# Patient Record
Sex: Male | Born: 1980 | Race: White | Hispanic: No | Marital: Single | Smoking: Never smoker
Health system: Southern US, Community
[De-identification: ages and names within clinical notes are randomized; demographics above are authoritative.]

## PROBLEM LIST (undated history)

## (undated) DIAGNOSIS — Z87442 Personal history of urinary calculi: Secondary | ICD-10-CM

## (undated) DIAGNOSIS — G809 Cerebral palsy, unspecified: Secondary | ICD-10-CM

## (undated) DIAGNOSIS — K219 Gastro-esophageal reflux disease without esophagitis: Secondary | ICD-10-CM

## (undated) DIAGNOSIS — J45909 Unspecified asthma, uncomplicated: Secondary | ICD-10-CM

## (undated) HISTORY — DX: Cerebral palsy, unspecified: G80.9

## (undated) HISTORY — DX: Personal history of urinary calculi: Z87.442

## (undated) HISTORY — DX: Gastro-esophageal reflux disease without esophagitis: K21.9

## (undated) HISTORY — PX: LITHOTRIPSY: SUR834

## (undated) HISTORY — PX: HIP OSTEOTOMY: SHX984

## (undated) HISTORY — DX: Unspecified asthma, uncomplicated: J45.909

## (undated) HISTORY — PX: OSTEOTOMY: SHX137

---

## 2005-04-04 ENCOUNTER — Ambulatory Visit: Admission: RE | Admit: 2005-04-04 | Discharge: 2005-04-04 | Payer: Self-pay | Admitting: Family Medicine

## 2009-04-28 ENCOUNTER — Inpatient Hospital Stay (HOSPITAL_COMMUNITY): Admission: RE | Admit: 2009-04-28 | Discharge: 2009-05-02 | Payer: Self-pay | Admitting: Urology

## 2010-03-11 IMAGING — CR DG ABD PORTABLE 1V
1 series · 1 of 1 positions shown · non-contrast
Comparison: Images obtained during the percutaneous nephrostomy
procedure 04/28/2009.

CLINICAL DATA: Postop right percutaneous nephrolithotomy.

PORTABLE ABDOMEN - 1 VIEW [DATE]/[id number] hours:

[view not recorded]
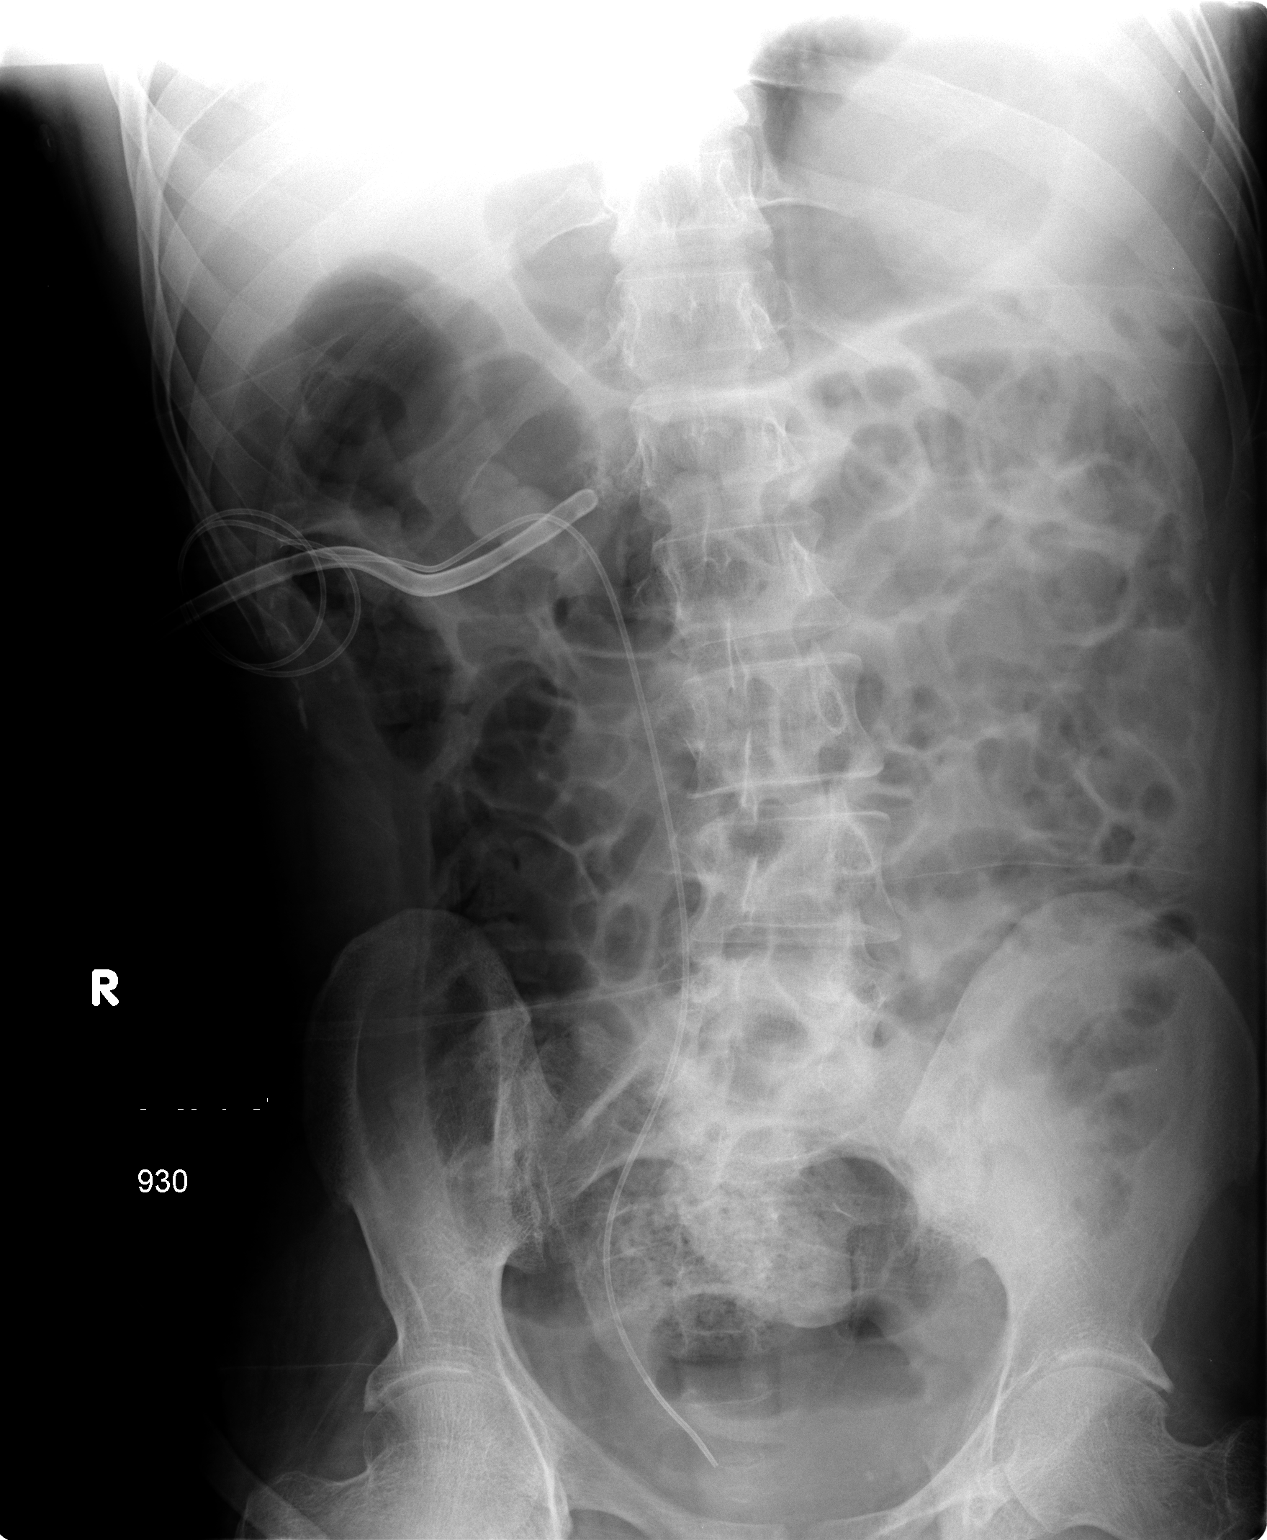

[1 of 1 positions shown; findings below may reference images not displayed]

FINDINGS: Catheter coursing through the renal collecting system
with its distal tip in the bladder.  Nephrostomy catheter overlying
the renal pelvis.  Residual stone fragments of which are either in
the renal pelvis or mid and upper pole calyces.  Gas throughout
nondistended small bowel and colon.  No free intraperitoneal air.
Small amount of retroperitoneal gas as expected postoperatively.
IMPRESSION: 1.  Catheters appropriately positioned.  Residual stone fragments
which are either in the right renal pelvis or in mid and upper pole
calyces.
2.  Mild post-operative ileus.

## 2010-03-12 IMAGING — XA IR BILIARY CATHETER EXCHANGE
1 series · 11 of 11 positions shown · non-contrast
Comparison: none

CLINICAL DATA: Status post percutaneous nephrolithotomy procedure
on 04/29/2009 to remove a staghorn calculus of the right kidney.
The patient has an indwelling large bore Council-tip nephrostomy in
place as well as a 5-French catheter advanced to the level of the
bladder.

[Series 1000: run · 0.16mm/px · 11 of 11 slices shown]
[im 1/11]
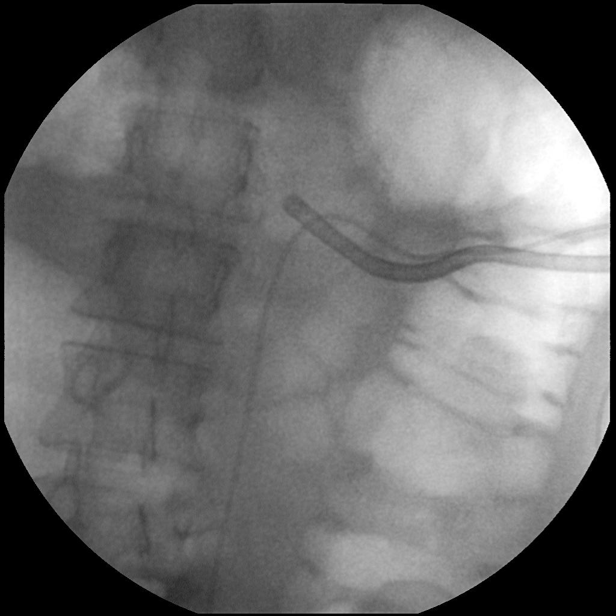
[im 2/11]
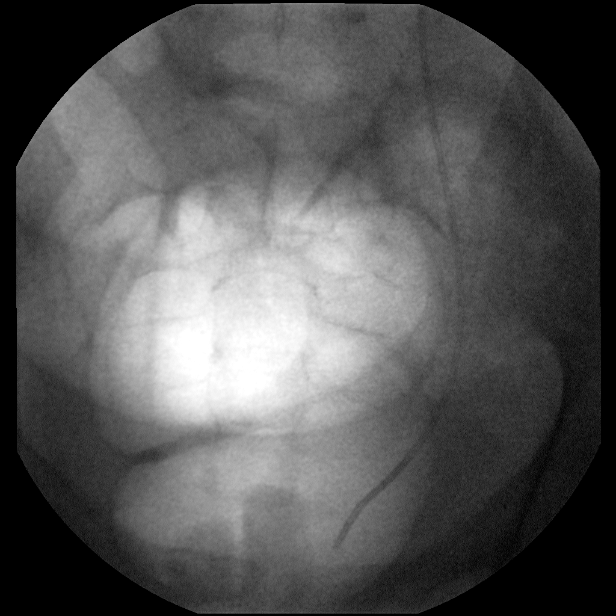
[im 3/11]
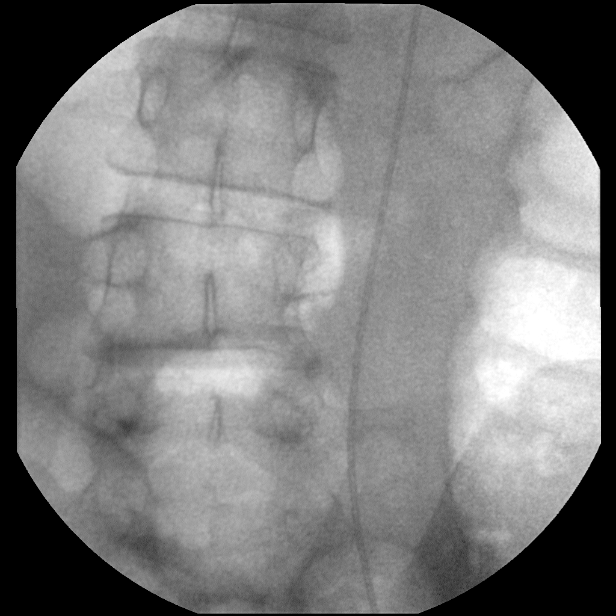
[im 4/11]
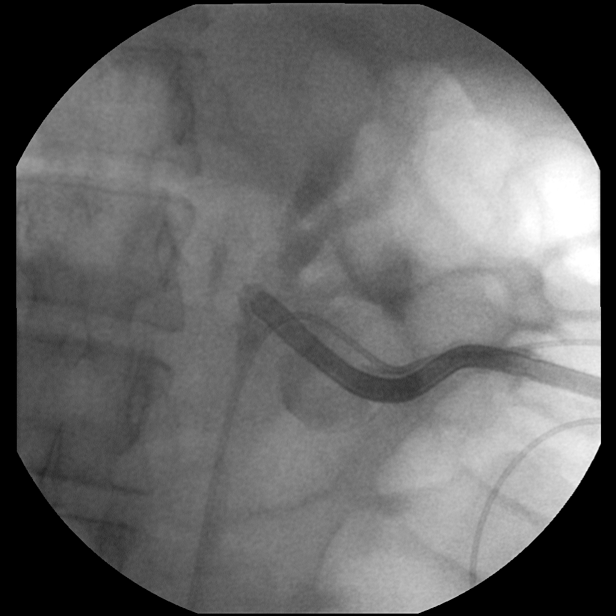
[im 5/11]
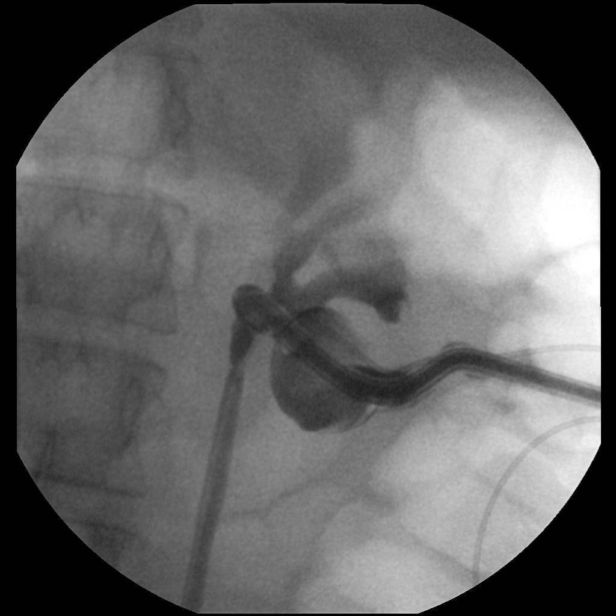
[im 6/11]
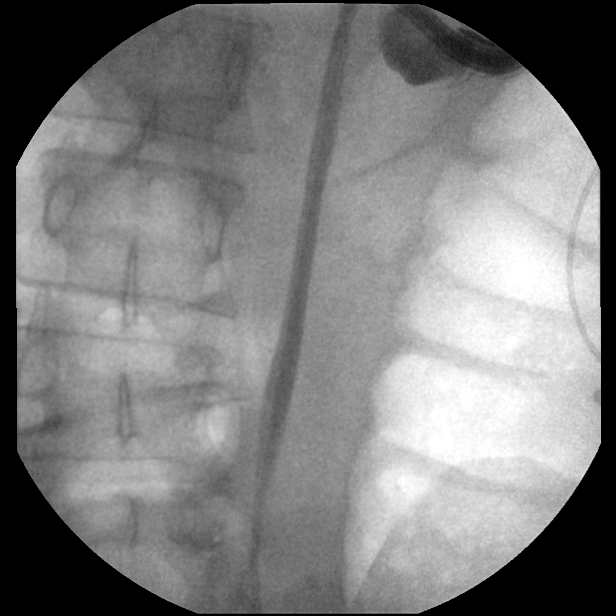
[im 7/11]
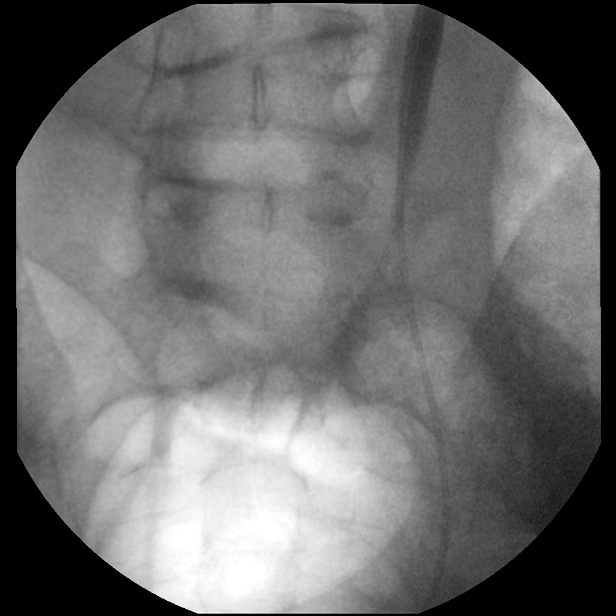
[im 8/11]
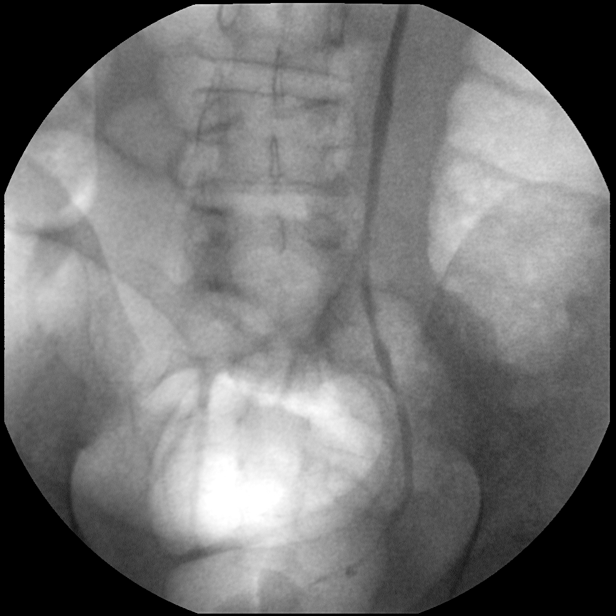
[im 9/11]
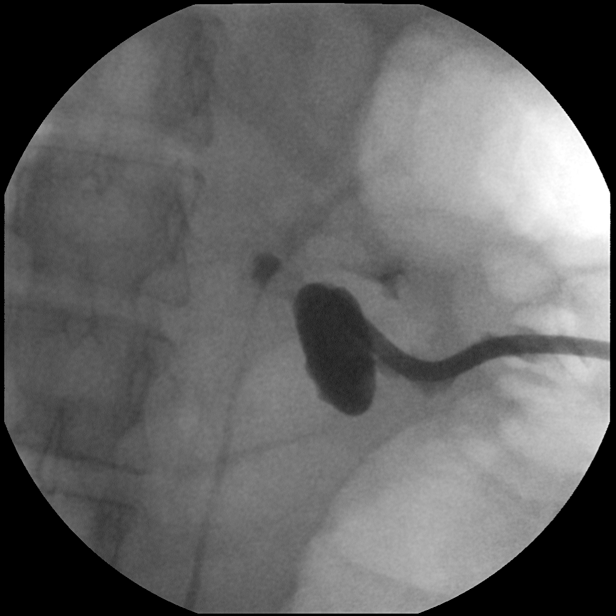
[im 10/11]
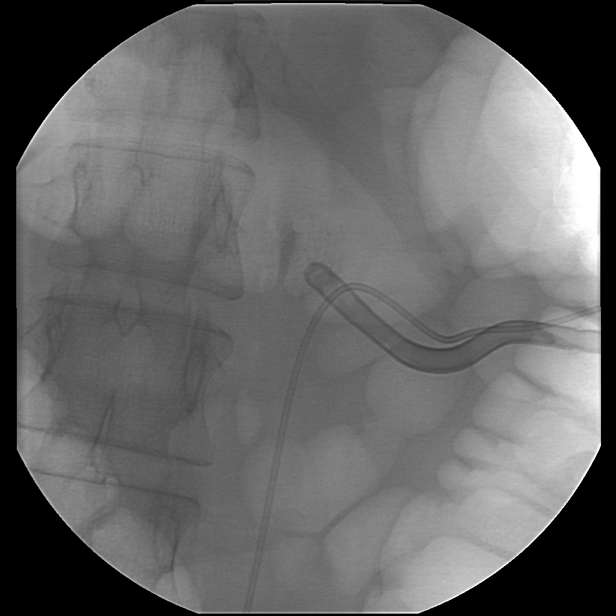
[im 11/11]
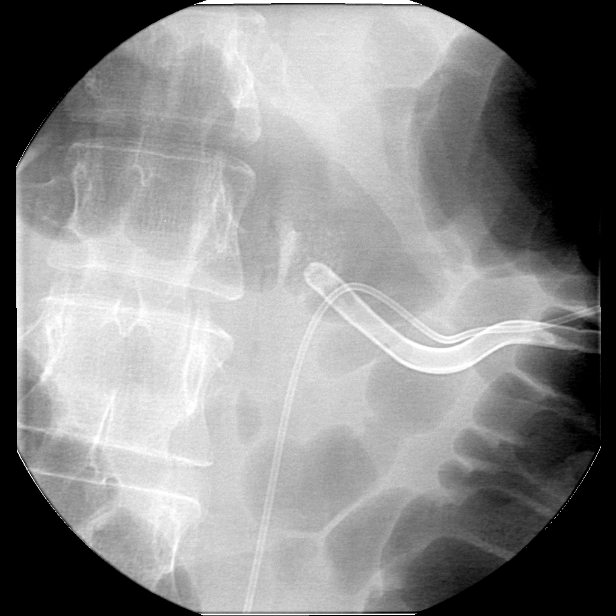

[11 of 11 positions shown; findings below may reference images not displayed]

1.  RIGHT NEPHROSTOGRAM.
2.  RIGHT PERCUTANEOUS NEPHROSTOMY TUBE EXCHANGE

Contrast:  30 ml [id number]

Fluoroscopy Time: 2.9 minutes.

Procedure:  The procedure, risks, benefits, and alternatives were
explained to the patient.  Questions regarding the procedure were
encouraged and answered.  The patient understands and consents to
the procedure.

The current right-sided nephrostomy access was prepped with
betadine in a sterile fashion, and a sterile drape was applied
covering the operative field.  A sterile gown and sterile gloves
were used for the procedure. Local anesthesia was provided with 1%
Lidocaine.  Ultrasound image documentation was performed.
Fluoroscopy during the procedure and fluoro spot radiograph
confirms appropriate catheter position.

Initial fluoroscopic imaging was performed of the nephrostomy
catheters and along the course of the ureter and bladder.
Nephrostogram was performed through the large bore Council-tip
catheter.  Imaging was performed of the collecting system and
ureter.  Additional evaluation of distal ureteral patency was
performed as the 5-French catheter was withdrawn and injected with
contrast material under fluoroscopy.

The Council catheter was removed.  Over a guidewire, a 14-French
pigtail nephrostomy catheter was advanced and formed.  This was
injected with contrast material.  The catheter was connected to
gravity drainage.  The catheter was secured at the skin with a
Prolene retention suture.

Complications: None
FINDINGS: No significant stone fragments were identified on the
nephrostogram.  There did appear to be potentially some small faint
fragments near the tip of the Council-tip catheter.  Injection
shows the Council catheter to be within a contracted renal pelvis.
The renal collecting system fills well and shows no evidence of
significant filling defect or contrast extravasation.  The ureter
is normally patent and shows no filling defects.

The 14-French nephrostomy catheter was formed at the level of a
dilated lower pole collecting system as the renal pelvis is
contracted.
IMPRESSION: Nephrostogram shows no significant retained stone fragments in the
collecting system and no evidence of extravasation with contrast
injection.  The ureter is widely patent.  Larger bore Council
catheter was replaced with a 14-French nephrostomy catheter formed
at the level of a dilated lower pole collecting system.

## 2011-01-10 ENCOUNTER — Encounter: Payer: Self-pay | Admitting: Urology

## 2011-03-29 LAB — BASIC METABOLIC PANEL
BUN: 7 mg/dL (ref 6–23)
CO2: 28 mEq/L (ref 19–32)
Calcium: 8.5 mg/dL (ref 8.4–10.5)
Creatinine, Ser: 0.56 mg/dL (ref 0.4–1.5)
Creatinine, Ser: 0.75 mg/dL (ref 0.4–1.5)
GFR calc non Af Amer: >60 mL/min (ref >60)
GFR calc non Af Amer: >60 mL/min (ref >60)
Glucose, Bld: 107 mg/dL — ABNORMAL HIGH (ref 70–99)
Glucose, Bld: 157 mg/dL — ABNORMAL HIGH (ref 70–99)
Sodium: 133 mEq/L — ABNORMAL LOW (ref 135–145)

## 2011-03-29 LAB — CBC
HCT: 40.3 % (ref 39.0–52.0)
HCT: 42.7 % (ref 39.0–52.0)
HCT: 50.5 % (ref 39.0–52.0)
Hemoglobin: 13.2 g/dL (ref 13.0–17.0)
Hemoglobin: 13.6 g/dL (ref 13.0–17.0)
Hemoglobin: 15 g/dL (ref 13.0–17.0)
MCHC: 33.9 g/dL (ref 30.0–36.0)
MCHC: 33.9 g/dL (ref 30.0–36.0)
MCHC: 35.2 g/dL (ref 30.0–36.0)
MCV: 83.5 fL (ref 78.0–100.0)
MCV: 85.9 fL (ref 78.0–100.0)
Platelets: 271 10*3/uL (ref 150–400)
Platelets: 297 10*3/uL (ref 150–400)
Platelets: 320 10*3/uL (ref 150–400)
RBC: 4.7 MIL/uL (ref 4.22–5.81)
RDW: 13.1 % (ref 11.5–15.5)
RDW: 13.4 % (ref 11.5–15.5)
RDW: 13.6 % (ref 11.5–15.5)
RDW: 13.7 % (ref 11.5–15.5)
WBC: 10.1 10*3/uL (ref 4.0–10.5)
WBC: 9.5 10*3/uL (ref 4.0–10.5)

## 2011-03-29 LAB — APTT: aPTT: 32 seconds (ref 24–37)

## 2011-05-03 NOTE — Op Note (Signed)
NAMEVED, MARTOS NO.:  [id number]   MEDICAL RECORD NO.:  [id number]          PATIENT TYPE:  INP   LOCATION:  1436                         FACILITY:  [facility]   PHYSICIAN:  Bertram Millard. Dahlstedt, M.D.DATE OF BIRTH:  12/08/1981   DATE OF PROCEDURE:  04/29/2009  DATE OF DISCHARGE:                               OPERATIVE REPORT   PREOPERATIVE DIAGNOSIS:  Large right renal pelvic stone.   POSTOPERATIVE DIAGNOSIS:  Large right renal pelvic stone.   OPERATION PERFORMED:  Percutaneous nephrolithotomy.   SURGEON:  Bertram Millard. Dahlstedt, M.D.   ANESTHESIA:  General endotracheal.   COMPLICATIONS:  Small renal pelvic perforation.   ESTIMATED BLOOD LOSS:  150 mL.   BRIEF HISTORY:  This 30 year old male recently presented to me with  right flank pain.  The patient had a diagnosis of a large right renal  pelvic staghorn stone.  The patient has been intermittently symptomatic  for over a year.  Evaluation revealed the patient to have this stone.  He does have cerebral palsy but otherwise is fairly medically stable.  Due to the patient's long term pain, and the size of the stone, it was  recommended that he undergo percutaneous nephrolithotomy.  Alternative  of cystoscopy, stent placement with eventual lithotripsy was discussed  as well.  Due to the size of his stone, I have recommended that we  attempt to treat this in one setting with percutaneous nephrolithotomy.  The risks and complications have been discussed with the patient at  length.  He understands these and agrees to proceed.   DESCRIPTION OF PROCEDURE:  The patient was identified in the holding  area.  The surgical side was marked.  He received preoperative IV  antibiotics.  He was taken to the operating room where general  endotracheal anesthetic was administered.  He was then placed in the  prone position after his bladder was catheterized.  All extremities were  padded appropriately.  His right flank was  prepped and draped.  The  previously placed Kumpe catheter was prepped into the field.  Using  fluoroscopic guidance, I passed the guidewire down this catheter into  the bladder.  The catheter was then removed, and another access  catheter, approximately 10 [place], was passed over the guidewire.  This  allowed me to pass a safety wire, again down to the bladder using  fluoroscopic guidance.  This catheter was removed.  The nephrostomy  tract was then dilated to approximately 28 [place] and then nephrostomy  access sheath was placed over the dilation balloon.  Fluoroscopy was  used to place this sheath.  Once this was adequately located in the  renal pelvis, I then removed the balloon dilator.  The nephroscope was  then placed.  The large stone was seen in the renal pelvis.  There was  mild infundibular stenosis before I reached the pelvis.  The Swiss  LithoClast was then placed through the scope.  Using the ultrasonic  device, the stone was easily broken up and aspirated.  The majority of  the stone was aspirated with the ultrasound, with very small  fragments  left.  The larger fragments were then extracted with the grasper.  At  this point it appeared that there had been a very small perforation in  the medial aspect of the renal pelvis.  A couple of fragments were  retrieved.  Careful inspection of the remainder of the pelvis and the  upper and lower pole calices with the nephroscope revealed no further  stones.  Nephrostogram revealed adequate filling of the calices, but the  small perforation with extravasation.  I then placed a 20 [place] Council  tip catheter overtop of one of the guidewires.  The balloon was filled  with approximately 5 mL of saline.  It irrigated appropriately.  At this  point the access sheath was removed.  The Kumpe catheter was then placed  over the safety wire down into the proximal ureter.  Both of these were  sutured to the skin with 2-0 silk.  The catheter was  draining  appropriately at this point.  There was hemostasis at the nephrostomy  tube site.  Dry sterile dressings were placed.  The patient tolerated  the procedure well.  He was awakened, extubated, and taken to the PACU  in stable condition.      Bertram Millard. Dahlstedt, M.D.  Electronically Signed     SMD/MEDQ  D:  04/29/2009  T:  04/29/2009  Job:  [id number]

## 2011-05-06 NOTE — Discharge Summary (Signed)
NAMEJOFFRE, LUCKS NO.:  [id number]   MEDICAL RECORD NO.:  [id number]          PATIENT TYPE:  INP   LOCATION:  1436                         FACILITY:  [facility]   PHYSICIAN:  Bertram Millard. Dahlstedt, M.D.DATE OF BIRTH:  03-Jul-1981   DATE OF ADMISSION:  04/28/2009  DATE OF DISCHARGE:  05/02/2009                               DISCHARGE SUMMARY   ADMISSION DIAGNOSIS:  Large right renal pelvic staghorn stone.   DISCHARGE DIAGNOSIS:  Large right renal pelvic staghorn stone.   PROCEDURES:  1. Placement of right percutaneous nephrostomy tube per Interventional      Radiology.  2. Right percutaneous nephrolithotomy.  3. Right nephrostogram for Interventional Radiology.  4. Right percutaneous nephrostomy tube exchange per Interventional      Radiology.   HISTORY AND PHYSICAL:  For details, please add admission history and  physical.  Briefly, [name] is a 30 year old gentleman who was found  to have intermittent symptomatic right flank pain for over one year.  Evaluation revealed that he had a large right renal pelvic staghorn  stone.   PAST MEDICAL HISTORY:  Cerebral palsy but he is otherwise fairly  medically stable.   Due to the patient's long term pain and size of the stone, it was  recommended that he undergo placement of right percutaneous nephrostomy  tube along with right subcutaneous nephrolithotomy.  Therefore, he was  admitted for placement of right subcutaneous nephrostomy tube per  Interventional Radiology with percutaneous nephrolithotomy to be  performed the next day.   HOSPITAL COURSE:  On Apr 28, 2009 he was admitted for placement of right  percutaneous nephrostomy tube by Interventional Radiology.  He tolerated  this well without any complications.  On Apr 29, 2009, he was taken to  the operating room where he underwent a right percutaneous  nephrolithotomy which he tolerated well without complications.  Postoperatively, he remained  hemodynamically stable with a postoperative  hematocrit of 42.7.  He remained hemodynamically stable throughout his  hospitalization as noted by his hematocrit of 40.3 and 38.9  consecutively.  His renal function was also found to be stable as noted  by his serum creatinine of 0.56.  He maintained excellent urine output  throughout hospital course as it gradually cleared prior to discharge  home.  On May 01, 2009, the right nephrostogram was performed by  Interventional Radiology.  The results showed no significant retained  stone fragments with no obstruction within the collecting system and no  extravasation.  The ureter was also found to be patent.  At that time  the Kumpe was removed and the larger bore council catheter was replaced  with a smaller 14-French nephrostomy catheter.  He did have some  postoperative nausea although it was felt to be related to his narcotic  pain medication.  This nausea did subside prior to his discharge home.  His nephrostomy tube was draining clear yellow urine very well on May 02, 2009.  He was found to be hemodynamically stable and without  complaints.  His nephrostomy tube was patent and draining clear yellow  urine.  Therefore,  he was felt ready for discharge home as he had met  all discharge criteria.   DISPOSITION:  Home.   DISCHARGE MEDICATIONS:  He was instructed to resume his regular home  medications.  In addition, he was provided a prescription for Percocet  to use for pain and to use Colace as a stool softener.  He was also  provided a prescription for an antibiotic to use for one week prior to  his return visit for removal of nephrostomy tube.   DISCHARGE INSTRUCTIONS:  He was instructed to be ambulatory but  specifically told to refrain from any heavy lifting, strenuous activity  or driving.  He was instructed on routine nephrostomy care and told to  gradually advance his diet over the course of the next few days.   FOLLOW UP:  He  will follow up in one week for further evaluation and  probable removal of right percutaneous nephrostomy tube.      Delia Chimes, NP      Bertram Millard. Dahlstedt, M.D.  Electronically Signed    MA/MEDQ  D:  06/19/2009  T:  06/19/2009  Job:  [id number]

## 2013-12-02 ENCOUNTER — Ambulatory Visit (HOSPITAL_COMMUNITY): Payer: Self-pay | Admitting: Specialist

## 2013-12-06 ENCOUNTER — Ambulatory Visit (HOSPITAL_COMMUNITY)
Admission: RE | Admit: 2013-12-06 | Discharge: 2013-12-06 | Disposition: A | Discharge status: Home / Self Care | Payer: Medicare Other | Source: Ambulatory Visit | Attending: Family Medicine | Admitting: Family Medicine

## 2013-12-06 DIAGNOSIS — M25519 Pain in unspecified shoulder: Secondary | ICD-10-CM | POA: Insufficient documentation

## 2013-12-06 DIAGNOSIS — G809 Cerebral palsy, unspecified: Secondary | ICD-10-CM | POA: Insufficient documentation

## 2013-12-06 DIAGNOSIS — IMO0001 Reserved for inherently not codable concepts without codable children: Secondary | ICD-10-CM | POA: Insufficient documentation

## 2013-12-17 NOTE — Evaluation (Addendum)
Occupational Therapy Evaluation  Patient Details  Name: AESON SAWYERS MRN: [id number] Date of Birth: 06-14-1981  Today's Date: 12/06/2013 Time: 1340-1415 OT Time Calculation (min): 35 min Evaluation Only   Visit#: 1 of 1     Assessment Diagnosis: Cerebral Palsy   Past Medical History: No past medical history on file. Past Surgical History: No past surgical history on file.  Subjective Symptoms/Limitations Symptoms: S:  I need a power chair so I can get around and do the things i need to do and now have to wait on someone else to help me Pain Assessment Currently in Pain?: Yes Pain Score: 5  Pain Location: Shoulder Pain Orientation: Right  Precautions/Restrictions  Precautions Precautions: Fall   Please refer to letter of medical necessity for additional information.   12/06/13      Mr. Brelan Hannen is a 32year old male  who has been referred to occupational therapy for assessment for need of a power wheelchair secondary to diagnosis of Cerebral Palsy and muscle weakness.  He was evaluated on 11/06/2013 with caregiver (girlfriend) present and representative from [facility].  Mr. Nunn is a well nourished, 5'7", left handed male noted to wear glasses.  His full medical history is unavailable at the time of this evaluation however has diagnosis/limitations of Cerebral Palsy.      Mr. Shakespeare lives with his girlfriend Arline Asp) in a single level home/trailer with ramp to enter.  He requires increased assistance with lower body bathing/dressing, functional mobility/transfers and community access.       Mr. Marczak would like a power wheelchair to improve his safety and independence in his living environment and in the community.   He presents to this evaluation in an older transport chair and reports  is presently in an older, power wheelchair which he has had for sometime and is no longer meeting his needs.   A FULL PHYSICAL ASSESSMENT REVEALS THE FOLLOWING Existing Equipment:  Mr. Forget states he has a current Pronto power chair which no longer meets his needs, K walker and shower chair.  Transfers: Mr Bamba is able to perform sit><stand from manual chair and mat this date with min-mod assistance Ambulation:  Mr. Orvis ambulates ~25 feet x2 this date with use of therapist/caregiver support and increased complaint of shoulder pain this date.Balance:  WFL static sitting; fair standingHead and Neck: forward flexed, slightly kyphotic posture  Trunk: 75% Pelvis: significant posterior tilt Hip: grossly 75%+  AROM with noted increased tone, internally rotated and adducted Knees: grossly 70% AROM, unable to attain full extension. Reports he has had multiple surgeries on his legs for contracture management. Feet and Ankles:  grossly 75% with reports of significant increased edema in BLE at timesUpper Extremities:  limited AROM in BUE.  left upper extremity range is grossly 75% with strength is 3+/5 and right upper extremity  grossly 75% with increased complaint of pain from reported recent injury and is being managed by MD however is limited as weight bearing increases pain and patient reports feeling unsteady/unsafe on UE.  RUE strength grossly 3/5.  Mr. Ocallaghan has noted mild increased tone throughout her upper extremities.  Lower Extremities: grossly 3+/5 BLE with increased tone and noted increased scarring from multiple surgeries.  Increased spastic tone throughout bilateral lower extremities.Weight Shifting Ability: fair+/good- ability to weight shift in chair with increased complaint of right UE pain/discomfort.Skin Integrity: Mr. Isidore reports buttocks skin difficulties, pressure however no documentation at time of this eval to support.  Patient with  good skin integrity this date.   GOALS/OBJECTIVE OF SEATING INTERVENTION Recommendations:  Mr. Carandang has functional deficits in the areas of mobility and self care, which are a result of the above listed diagnosis.  Specific functional  limitations include: decreased ability to ambulate distance greater than household, difficulties with transfers and weight shifting  secondary to  functional use/strength of bilateral upper and lower extremities due to pain, contractures.   He is unable to functionally propel a lightweight, standard  or manual wheelchair for independent mobility within her living environment due to  increased tone, weakness in bilateral upper extremities and increased pain in right shoulder.     Mr. Clemons  will use the power wheelchair on a daily basis as his primary means of mobility to increase independence, promote community integration and improve/maintain quality of life .The recommended wheelchair meets current and future positioning needs, accessibility, durability, and safety requirements for functional use within patient's living environment. It is the most practical, functional and cost effective power wheelchair that meets the patients current and future needs.  Please see assessment recommendation/letter from [facility] representative, not available to this therapist at time of this documentation.   If you require any further information concerning Mr. Silversmith's positioning, mobility needs or any further documentation why a lesser device will not work, please do not hesitate to contact me at [facility], [address], [place] [address] [phone].   __________________     ______ Velora Mediate, OTR/L     Date         Occupational Therapy Assessment and Plan OT Assessment and Plan Clinical Impression Statement: A:  Patient assessed for medical necessity of power chair.  Patient with increased pain right shoulder seocndary to recent injury, decreased functional independence, decreased ability to mobilize self independently.   OT Plan: P:  Recommend patient with functional need for power chair to increase moiblity and improve quality of life, decrease burden of care.     Problem  List There are no active problems to display for this patient.   End of Session Activity Tolerance: Patient tolerated treatment well General Behavior During Therapy: WFL for tasks assessed/performed   GO Functional Limitation: Mobility: Walking and moving around Mobility: Walking and Moving Around Current Status (K7425): At least 40 percent but less than 60 percent impaired, limited or restricted Mobility: Walking and Moving Around Goal Status [phone]): At least 20 percent but less than 40 percent impaired, limited or restricted (once patient has new power chair for moiblity ) Mobility: Walking and Moving Around Discharge Status [phone]): At least 40 percent but less than 60 percent impaired, limited or restricted    Velora Mediate, OTR/L  12/06/2013, 8:32 PM  Physician Documentation Your signature is required to indicate approval of the treatment plan as stated above.  Please sign and either send electronically or make a copy of this report for your files and return this physician signed original.  Please mark one 1.__approve of plan  2. ___approve of plan with the following conditions.   ______________________________                                                          _____________________ Physician Signature  Date  

## 2014-01-14 NOTE — Addendum Note (Signed)
Encounter addended by: Velora MediateHeather Sahas Sluka, OT on: 01/14/2014 12:41 PM<BR>     Documentation filed: Clinical Notes, Letters

## 2014-09-01 ENCOUNTER — Ambulatory Visit (HOSPITAL_COMMUNITY): Payer: Medicare Other | Admitting: Psychiatry

## 2014-09-24 ENCOUNTER — Encounter (INDEPENDENT_AMBULATORY_CARE_PROVIDER_SITE_OTHER): Payer: Self-pay

## 2014-09-24 ENCOUNTER — Encounter (HOSPITAL_COMMUNITY): Payer: Self-pay | Admitting: Psychiatry

## 2014-09-24 ENCOUNTER — Ambulatory Visit (INDEPENDENT_AMBULATORY_CARE_PROVIDER_SITE_OTHER): Payer: Medicare Other | Admitting: Psychiatry

## 2014-09-24 VITALS — BP 140/90 | HR 88

## 2014-09-24 DIAGNOSIS — F331 Major depressive disorder, recurrent, moderate: Secondary | ICD-10-CM

## 2014-09-24 DIAGNOSIS — F332 Major depressive disorder, recurrent severe without psychotic features: Secondary | ICD-10-CM

## 2014-09-24 MED ORDER — HYDROXYZINE PAMOATE 25 MG PO CAPS: ORAL_CAPSULE | ORAL | Status: DC

## 2014-09-24 MED ORDER — LAMOTRIGINE 25 MG PO TABS: ORAL_TABLET | ORAL | Status: DC
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
## 2014-10-07 ENCOUNTER — Other Ambulatory Visit (HOSPITAL_COMMUNITY): Payer: Self-pay | Admitting: Psychiatry

## 2014-10-08 ENCOUNTER — Other Ambulatory Visit (HOSPITAL_COMMUNITY): Payer: Self-pay | Admitting: Psychiatry

## 2014-10-15 ENCOUNTER — Ambulatory Visit (INDEPENDENT_AMBULATORY_CARE_PROVIDER_SITE_OTHER): Payer: Medicare Other | Admitting: Psychiatry

## 2014-10-15 ENCOUNTER — Encounter (HOSPITAL_COMMUNITY): Payer: Self-pay | Admitting: Psychiatry

## 2014-10-15 VITALS — BP 166/115 | HR 86 | Ht 67.0 in | Wt 172.0 lb

## 2014-10-15 DIAGNOSIS — F333 Major depressive disorder, recurrent, severe with psychotic symptoms: Secondary | ICD-10-CM

## 2014-10-15 DIAGNOSIS — F331 Major depressive disorder, recurrent, moderate: Secondary | ICD-10-CM

## 2014-10-15 DIAGNOSIS — F4310 Post-traumatic stress disorder, unspecified: Secondary | ICD-10-CM

## 2014-10-15 MED ORDER — CLONAZEPAM 0.5 MG PO TABS
0.5000 mg | ORAL_TABLET | Freq: Two times a day (BID) | ORAL | Status: DC
Start: 2014-10-15 — End: 2014-11-10

## 2014-10-15 MED ORDER — HYDROXYZINE PAMOATE 50 MG PO CAPS: 50.0000 mg | ORAL_CAPSULE | Freq: Every day | ORAL | Status: DC

## 2014-10-15 MED ORDER — LAMOTRIGINE 100 MG PO TABS: 100.0000 mg | ORAL_TABLET | Freq: Every day | ORAL | Status: DC
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
## 2014-10-30 ENCOUNTER — Ambulatory Visit (HOSPITAL_COMMUNITY): Payer: Self-pay | Admitting: Psychiatry

## 2014-10-30 ENCOUNTER — Encounter (HOSPITAL_COMMUNITY): Payer: Self-pay | Admitting: Psychiatry

## 2014-10-30 ENCOUNTER — Ambulatory Visit (INDEPENDENT_AMBULATORY_CARE_PROVIDER_SITE_OTHER): Payer: Medicare Other | Admitting: Psychiatry

## 2014-10-30 DIAGNOSIS — F331 Major depressive disorder, recurrent, moderate: Secondary | ICD-10-CM

## 2014-10-30 NOTE — Patient Instructions (Signed)
Discussed orally 
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
## 2014-11-10 ENCOUNTER — Other Ambulatory Visit (HOSPITAL_COMMUNITY): Payer: Self-pay | Admitting: Psychiatry

## 2014-11-18 ENCOUNTER — Encounter (HOSPITAL_COMMUNITY): Payer: Self-pay | Admitting: Psychiatry

## 2014-11-18 ENCOUNTER — Ambulatory Visit (INDEPENDENT_AMBULATORY_CARE_PROVIDER_SITE_OTHER): Payer: Medicare Other | Admitting: Psychiatry

## 2014-11-18 VITALS — BP 132/100 | HR 100 | Ht 67.0 in | Wt 172.0 lb

## 2014-11-18 DIAGNOSIS — F333 Major depressive disorder, recurrent, severe with psychotic symptoms: Secondary | ICD-10-CM

## 2014-11-18 DIAGNOSIS — F331 Major depressive disorder, recurrent, moderate: Secondary | ICD-10-CM

## 2014-11-18 DIAGNOSIS — F4310 Post-traumatic stress disorder, unspecified: Secondary | ICD-10-CM

## 2014-11-18 MED ORDER — FLUOXETINE HCL 10 MG PO CAPS: ORAL_CAPSULE | ORAL | Status: AC

## 2014-11-18 MED ORDER — LAMOTRIGINE 150 MG PO TABS: 150.0000 mg | ORAL_TABLET | Freq: Every day | ORAL | Status: AC

## 2014-11-18 MED ORDER — CLONAZEPAM 0.5 MG PO TABS: 0.5000 mg | ORAL_TABLET | Freq: Two times a day (BID) | ORAL | Status: AC

## 2014-11-18 MED ORDER — HYDROXYZINE PAMOATE 50 MG PO CAPS: 50.0000 mg | ORAL_CAPSULE | Freq: Every day | ORAL | Status: AC

## 2014-11-18 NOTE — Progress Notes (Signed)
[facility] [id number] Progress Note   Kyle ClassKeith C Little [id number] 33 y.o.  11/18/2014 11:38 AM  Chief Complaint:  I am sleeping better however I still have irritability and anger.  I don't think Wellbutrin is helping.  I still have low mood.    History of Present Illness:  Kyle DanceKeith came for his follow-up appointment.  On his last visit we stopped Xanax and recommended to try Klonopin.  He liked the Klonopin because he sleeping better and he is less anxious and less nervous in the nighttime.  However he continues to have difficulty morning and daytime.  He admitted having argument with his mother and lately he is thinking about his ex-girlfriend and daughter.  He start seeing counseling with Kyle Little and he is hoping to get more appointment for counseling.  He is taking Lamictal and denies any rash or itching.  He continues to have anger a lot of frustration.  He is taking Vistaril 50 mg at bedtime.  Sometime he continues to hear voices office daughter who is calling him.  However he denies any active or passive suicidal thoughts or homicidal thought.  His appetite is okay.  Today his blood pressure is slightly increased which he believes because of the stress.  He has no other symptoms.  Patient denies drinking or using any illegal substances.  Suicidal Ideation: No Plan Formed: No Patient has means to carry out plan: No  Homicidal Ideation: No Plan Formed: No Patient has means to carry out plan: No  Past Psychiatric History/Hospitalization(s) Patient denies any previous history of psychiatric inpatient treatment, suicidal attempt.  He was given Celexa by his primary care physician last year but he developed severe GI symptoms.  Patient endorsed history of nightmares, flashback, depression, poor impulse control, anger and passive suicidal thoughts. Anxiety: Yes Bipolar Disorder: No Depression: Yes Mania: History of poor impulse control and highs and lows mood Psychosis: Patient endorse  paranoia, trust issues, having auditory and visual hallucination. Schizophrenia: No Personality Disorder: No Hospitalization for psychiatric illness: No History of Electroconvulsive Shock Therapy: No Prior Suicide Attempts: No  Medical History; Patient has cerebral palsy.  He has asthma, history of kidney stone and multiple hip surgeries.  His primary care physician is [name] at [facility].  Patient denies any seizures or any traumatic brain injury.  He also see [name] at [facility] for chronic pain.    Review of Systems  Constitutional: Positive for malaise/fatigue. Negative for weight loss.  Neurological:       Patient cannot walk.  He has cerebral palsy.  Psychiatric/Behavioral: Positive for hallucinations. The patient is nervous/anxious and has insomnia.     Psychiatric: Agitation: Yes Hallucination: Yes Depressed Mood: Yes Insomnia: No Hypersomnia: No Altered Concentration: No Feels Worthless: No Grandiose Ideas: No Belief In Special Powers: No New/Increased Substance Abuse: No Compulsions: No  Neurologic: Headache: No Seizure: No Paresthesias: The patient has cerebral palsy   Musculoskeletal: Strength & Muscle Tone: spastic, abnormal and decreased Gait & Station: unable to stand Patient leans: Patient uses a wheelchair   Outpatient Encounter Prescriptions as of 11/18/2014  Medication Sig  . ALBUTEROL SULFATE HFA IN Inhale into the lungs.  . cetirizine (ZYRTEC) 10 MG tablet Take 10 mg by mouth daily.  . clonazePAM (KLONOPIN) 0.5 MG tablet Take 1 tablet (0.5 mg total) by mouth 2 (two) times daily.  . diclofenac (VOLTAREN) 75 MG EC tablet Take 75 mg by mouth 2 (two) times daily.  Marland Kitchen. FLUoxetine (PROZAC) 10  MG capsule Take 1 capsule daily for 1 week and than 2 capsule daily  . hydrOXYzine (VISTARIL) 50 MG capsule Take 1 capsule (50 mg total) by mouth at bedtime.  . lamoTRIgine (LAMICTAL) 150 MG tablet Take 1 tablet (150 mg total) by  mouth daily.  . methocarbamol (ROBAXIN) 750 MG tablet Take 750 mg by mouth 2 (two) times daily.  Marland Kitchen. oxycodone-acetaminophen (PERCOCET) 2.5-325 MG per tablet Take 1 tablet by mouth every 4 (four) hours as needed for pain.  . [DISCONTINUED] buPROPion (WELLBUTRIN XL) 300 MG 24 hr tablet Take 300 mg by mouth daily.  . [DISCONTINUED] clonazePAM (KLONOPIN) 0.5 MG tablet TAKE ONE TABLET BY MOUTH TWICE DAILY  . [DISCONTINUED] hydrOXYzine (VISTARIL) 50 MG capsule Take 1 capsule (50 mg total) by mouth at bedtime.  . [DISCONTINUED] lamoTRIgine (LAMICTAL) 100 MG tablet Take 1 tablet (100 mg total) by mouth daily.    No results found for this or any previous visit (from the past 2160 hour(s)).    Constitutional:  BP 132/100 mmHg  Pulse 100  Ht 5\' 7"  (1.702 m)  Wt 172 lb (78.019 kg)  BMI 26.93 kg/m2   Mental Status Examination;  Patient is poorly groomed and disheveled . He uses a wheelchair.  His speech is loud and at times pressured.  He has increased tone and volume.  He remains very emotional and labile.  He described his mood is sad and irritable and his affect is mood appropriate.  He maintained fair eye contact.  He endorse auditory and visual hallucination.  He endorse that his daughter calling him and trying to get help.  He also endorsed that his deceased grandparents and angels  talking to him.  He endorse paranoia and has difficulty trusting people.  He denies any active or passive suicidal thoughts or homicidal thought.  His psychomotor activity is slightly increased.  His attention and concentration is fair.  His fund of knowledge is good.  His cognition is good.  He is alert and oriented x3.  His insight judgment and impulse control is fair.   Review of Psycho-Social Stressors (1), Review or order clinical lab tests (1), New Problem, with no additional work-up planned (3), Review of Last Therapy Session (1), Review of Medication Regimen & Side Effects (2) and Review of New Medication or  Change in Dosage (2)  Assessment: Axis I: Major Depressive disorder, recurrent with psychotic features.  Rule out bipolar disorder depressed type.  PTSD.  Rule out mood disorder due to general medical condition  Axis II: Deferred  Axis III:  Past Medical History  Diagnosis Date  . CP (cerebral palsy)   . Asthma   . GERD (gastroesophageal reflux disease)   . History of kidney stones     Axis IV: Moderate   Plan:  Encouraged to see therapist more often for coping and social skills.  I will discontinue Wellbutrin since patient does not see any improvement.  We will try Prozac which she has never tried.  I will also increase Lamictal 150 mg daily.  Patient does not have any rash or itching.  Continue Klonopin 0.5 mg twice a day and Vistaril 50 mg at bedtime.  Recommended to call us back if he has any question or any concern.  I will see him again in 6 weeks. Discuss in detail medication side effects and benefits. Time spent 25 minutes.  More than 50% of the time spent in psychoeducation, counseling and coordination of care.  Discuss safety plan that anytime  having active suicidal thoughts or homicidal thoughts then patient need to call 911 or go to the local emergency room.    Parker Wherley T., MD 11/18/2014

## 2014-12-01 ENCOUNTER — Ambulatory Visit (INDEPENDENT_AMBULATORY_CARE_PROVIDER_SITE_OTHER): Payer: Medicare Other | Admitting: Psychiatry

## 2014-12-01 DIAGNOSIS — F331 Major depressive disorder, recurrent, moderate: Secondary | ICD-10-CM
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
## 2014-12-01 NOTE — Patient Instructions (Signed)
Discussed orally 

## 2014-12-15 ENCOUNTER — Ambulatory Visit (INDEPENDENT_AMBULATORY_CARE_PROVIDER_SITE_OTHER): Payer: Medicare Other | Admitting: Psychiatry

## 2014-12-15 DIAGNOSIS — F331 Major depressive disorder, recurrent, moderate: Secondary | ICD-10-CM

## 2014-12-15 NOTE — Patient Instructions (Signed)
Discussed orally 
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
## 2014-12-30 ENCOUNTER — Ambulatory Visit (HOSPITAL_COMMUNITY): Payer: Self-pay | Admitting: Psychiatry

## 2014-12-31 ENCOUNTER — Ambulatory Visit (HOSPITAL_COMMUNITY): Payer: Self-pay | Admitting: Psychiatry

## 2015-01-20 ENCOUNTER — Other Ambulatory Visit (HOSPITAL_COMMUNITY): Payer: Self-pay | Admitting: Psychiatry

## 2015-01-21 NOTE — Telephone Encounter (Signed)
LMOM for patient to call back, needs to schedule follow up for further refills.

## 2015-01-28 ENCOUNTER — Telehealth (HOSPITAL_COMMUNITY): Payer: Self-pay | Admitting: *Deleted

## 2015-01-28 NOTE — Telephone Encounter (Signed)
Patient left message on nurse's voice mail requesting refill of his medications--did not specify which medications. Per note in chart, medication was denied due to patient needed an appointment. Note is below. Called patient back and had to leave a message. clonazePAM (KLONOPIN) 0.5 MG tablet [Pharmacy Med Name: CLONAZEPAM 0.5 MG TABLET] 60 tablet  01/21/2015      Sig:  TAKE ONE TABLET BY MOUTH TWICE DAILY    Class:  Normal    DAW:  No    Reason for Refusal:  Patient needs an appointment    Refused By:  Cleotis NipperSyed T Arfeen, MD    FLUoxetine (PROZAC) 10 MG capsule [Pharmacy Med Name: FLUOXETINE HCL 10 MG CAPSULE] 60 capsule  01/21/2015     Sig:  TAKE ONE CAPSULE BY MOUTH DAILY FOR ONE WEEK AND THEN TAKE TWO (2) CAPSULES DAILY.    Class:  Normal    DAW:  No    Reason for Refusal:  Patient needs an appointment    Refused By:  Cleotis NipperSyed T Arfeen, MD    hydrOXYzine (VISTARIL) 50 MG capsule [Pharmacy Med Name: HYDROXYZINE PAMOATE 50 MG CAPSULE] 30 capsule  01/21/2015     Sig:  TAKE ONE CAPSULE BY MOUTH AT BEDTIME.    Class:  Normal    DAW:  No    Reason for Refusal:  Patient needs an appointment    Refused By:  Cleotis NipperSyed T Arfeen, MD    lamoTRIgine (LAMICTAL) 150 MG tablet [Pharmacy Med Name: LAMOTRIGINE 150 MG TABLET] 30 tablet  01/21/2015     Sig:  TAKE ONE TABLET BY MOUTH DAILY.    Class:  Normal    DAW:  No    Reason for Refusal:  Patient needs an appointment    Refused By:  Cleotis NipperSyed T Arfeen, MD

## 2015-01-29 ENCOUNTER — Telehealth (HOSPITAL_COMMUNITY): Payer: Self-pay | Admitting: *Deleted

## 2015-01-29 NOTE — Telephone Encounter (Signed)
Message left on voicemail about needing refills. Pt not seen at this practice, refill not appropriate.

## 2015-01-30 ENCOUNTER — Telehealth (HOSPITAL_COMMUNITY): Payer: Self-pay | Admitting: *Deleted

## 2015-01-30 NOTE — Telephone Encounter (Signed)
Patient left message on nurse's voice mail about needing refills. Unable to reach patient, left message for call back.

## 2015-02-09 ENCOUNTER — Other Ambulatory Visit (HOSPITAL_COMMUNITY): Payer: Self-pay

## 2015-02-09 NOTE — Telephone Encounter (Signed)
Patient requires follow-up appointment and if he is out of the town he should contact local psychiatrist and in the meantime prescription can be filled by urgent care or primary care physician.

## 2015-02-09 NOTE — Telephone Encounter (Signed)
Called patient back and left a message to inform Dr. Lolly MustacheArfeen could not authorize medication refills from out of state and patient should attempt to set up an appointment with a new psychiatrist there in [place].  Informed patient if he needed medications immediately to follow up with an Urgent Care or Emergency Department setting if not doing well and experiencing increased symptoms.  Informed patient he could also see a primary care provider or an evaluation and new orders as well.  Patient to call back as needed.

## 2015-02-09 NOTE — Telephone Encounter (Signed)
Telephone call from patient and his fiance stating they recently moved to [place] and are having a hard time getting set up for psychiatric services there.  Patient reported he had been off medication for 3 weeks "and I am having a really hard time".  Patient denied current suicidal or homicidal ideations but reported he is not sleeping and is not doing as well without medication.  Discussed patient's status with being off Lamictal now 3 weeks and would have to be restarted and tapered back up but also this would be impossible to monitor with patient now living out of state.  Questioned if patient had gone to a provider there and what attempts he had made to get set up for services.  Patient reported still trying to get his services arranged but really needed some medication.  Patient requested a least a refill of his Vistaril to be called in to the [facility] on [address] there at [phone].  Patient requested a call back and discussed he could go to an Urgent Care there or local emergency department if began to have thoughts to harm self or others.  Agreed to call patient back today to inform if Dr. Lolly MustacheArfeen would even consider filling any medication there in [place].

## 2024-09-18 DEATH — deceased
